# Patient Record
Sex: Male | Born: 2000 | Race: White | Hispanic: No | Marital: Single | Smoking: Current some day smoker
Health system: Southern US, Community
[De-identification: ages and names within clinical notes are randomized; demographics above are authoritative.]

## PROBLEM LIST (undated history)

## (undated) DIAGNOSIS — H6690 Otitis media, unspecified, unspecified ear: Secondary | ICD-10-CM

---
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
## 2019-03-27 ENCOUNTER — Other Ambulatory Visit: Payer: Self-pay

## 2019-03-27 ENCOUNTER — Encounter (HOSPITAL_COMMUNITY): Payer: Self-pay

## 2019-03-27 ENCOUNTER — Emergency Department (HOSPITAL_COMMUNITY): Payer: Medicaid Other

## 2019-03-27 ENCOUNTER — Emergency Department (HOSPITAL_COMMUNITY)
Admission: EM | Admit: 2019-03-27 | Discharge: 2019-03-27 | Disposition: A | Discharge status: Home / Self Care | Payer: Medicaid Other | Attending: Emergency Medicine | Admitting: Emergency Medicine

## 2019-03-27 DIAGNOSIS — Y92410 Unspecified street and highway as the place of occurrence of the external cause: Secondary | ICD-10-CM | POA: Diagnosis not present

## 2019-03-27 DIAGNOSIS — Y999 Unspecified external cause status: Secondary | ICD-10-CM | POA: Insufficient documentation

## 2019-03-27 DIAGNOSIS — M79632 Pain in left forearm: Secondary | ICD-10-CM | POA: Insufficient documentation

## 2019-03-27 DIAGNOSIS — M545 Low back pain: Secondary | ICD-10-CM | POA: Diagnosis not present

## 2019-03-27 DIAGNOSIS — F17210 Nicotine dependence, cigarettes, uncomplicated: Secondary | ICD-10-CM | POA: Insufficient documentation

## 2019-03-27 DIAGNOSIS — Y93I9 Activity, other involving external motion: Secondary | ICD-10-CM | POA: Insufficient documentation
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
## 2019-03-27 NOTE — ED Notes (Signed)
Patient awake alert, color pink,chest clear,good aeration,no retractions, 3 plus pulses,<2sec refill,moves without difficulty,awaiting provider
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
## 2019-03-27 NOTE — Discharge Instructions (Addendum)
Your x-rays were negative for fractures at this time. For your continued pain you can treat with alternating Tylenol, ibuprofen, plus ice. Please follow-up with your primary care provider if you feel that your pain is not improving.

## 2020-08-14 DIAGNOSIS — S93491A Sprain of other ligament of right ankle, initial encounter: Secondary | ICD-10-CM | POA: Diagnosis not present

## 2020-10-28 DIAGNOSIS — Z68.41 Body mass index (BMI) pediatric, less than 5th percentile for age: Secondary | ICD-10-CM | POA: Diagnosis not present

## 2020-10-28 DIAGNOSIS — R634 Abnormal weight loss: Secondary | ICD-10-CM | POA: Diagnosis not present

## 2020-10-28 DIAGNOSIS — Z13220 Encounter for screening for lipoid disorders: Secondary | ICD-10-CM | POA: Diagnosis not present

## 2020-10-28 DIAGNOSIS — Z0001 Encounter for general adult medical examination with abnormal findings: Secondary | ICD-10-CM | POA: Diagnosis not present

## 2020-10-28 DIAGNOSIS — Z0000 Encounter for general adult medical examination without abnormal findings: Secondary | ICD-10-CM | POA: Diagnosis not present

## 2020-11-13 DIAGNOSIS — Z7689 Persons encountering health services in other specified circumstances: Secondary | ICD-10-CM | POA: Diagnosis not present

## 2020-11-13 DIAGNOSIS — Z01818 Encounter for other preprocedural examination: Secondary | ICD-10-CM | POA: Diagnosis not present

## 2020-11-13 DIAGNOSIS — Z6841 Body Mass Index (BMI) 40.0 and over, adult: Secondary | ICD-10-CM | POA: Diagnosis not present

## 2020-11-13 DIAGNOSIS — F17200 Nicotine dependence, unspecified, uncomplicated: Secondary | ICD-10-CM | POA: Diagnosis not present

## 2020-11-13 DIAGNOSIS — M6281 Muscle weakness (generalized): Secondary | ICD-10-CM | POA: Diagnosis not present

## 2020-11-13 DIAGNOSIS — R29898 Other symptoms and signs involving the musculoskeletal system: Secondary | ICD-10-CM | POA: Diagnosis not present

## 2020-11-13 DIAGNOSIS — Z87891 Personal history of nicotine dependence: Secondary | ICD-10-CM | POA: Diagnosis not present

## 2020-11-13 DIAGNOSIS — Z7189 Other specified counseling: Secondary | ICD-10-CM | POA: Diagnosis not present

## 2020-11-13 DIAGNOSIS — Z79899 Other long term (current) drug therapy: Secondary | ICD-10-CM | POA: Diagnosis not present

## 2020-11-13 DIAGNOSIS — K219 Gastro-esophageal reflux disease without esophagitis: Secondary | ICD-10-CM | POA: Diagnosis not present

## 2020-11-13 DIAGNOSIS — I498 Other specified cardiac arrhythmias: Secondary | ICD-10-CM | POA: Diagnosis not present

## 2020-11-13 DIAGNOSIS — Z713 Dietary counseling and surveillance: Secondary | ICD-10-CM | POA: Diagnosis not present

## 2020-12-02 DIAGNOSIS — F17290 Nicotine dependence, other tobacco product, uncomplicated: Secondary | ICD-10-CM | POA: Diagnosis not present

## 2020-12-02 DIAGNOSIS — Z6841 Body Mass Index (BMI) 40.0 and over, adult: Secondary | ICD-10-CM | POA: Diagnosis not present

## 2020-12-02 DIAGNOSIS — F4320 Adjustment disorder, unspecified: Secondary | ICD-10-CM | POA: Diagnosis not present

## 2021-09-07 ENCOUNTER — Other Ambulatory Visit (HOSPITAL_BASED_OUTPATIENT_CLINIC_OR_DEPARTMENT_OTHER): Payer: Self-pay

## 2021-09-07 ENCOUNTER — Emergency Department (HOSPITAL_BASED_OUTPATIENT_CLINIC_OR_DEPARTMENT_OTHER)
Admission: EM | Admit: 2021-09-07 | Discharge: 2021-09-07 | Disposition: A | Discharge status: Home / Self Care | Payer: BC Managed Care – PPO | Attending: Emergency Medicine | Admitting: Emergency Medicine

## 2021-09-07 ENCOUNTER — Other Ambulatory Visit: Payer: Self-pay

## 2021-09-07 ENCOUNTER — Emergency Department (HOSPITAL_BASED_OUTPATIENT_CLINIC_OR_DEPARTMENT_OTHER): Payer: BC Managed Care – PPO

## 2021-09-07 ENCOUNTER — Encounter (HOSPITAL_BASED_OUTPATIENT_CLINIC_OR_DEPARTMENT_OTHER): Payer: Self-pay | Admitting: Obstetrics and Gynecology

## 2021-09-07 DIAGNOSIS — R1030 Lower abdominal pain, unspecified: Secondary | ICD-10-CM | POA: Diagnosis not present

## 2021-09-07 DIAGNOSIS — Y9302 Activity, running: Secondary | ICD-10-CM | POA: Insufficient documentation

## 2021-09-07 DIAGNOSIS — M545 Low back pain, unspecified: Secondary | ICD-10-CM | POA: Diagnosis present

## 2021-09-07 DIAGNOSIS — Y92007 Garden or yard of unspecified non-institutional (private) residence as the place of occurrence of the external cause: Secondary | ICD-10-CM | POA: Insufficient documentation

## 2021-09-07 DIAGNOSIS — W1830XA Fall on same level, unspecified, initial encounter: Secondary | ICD-10-CM | POA: Insufficient documentation

## 2021-09-07 MED ORDER — KETOROLAC TROMETHAMINE 30 MG/ML IJ SOLN
30.0000 mg | Freq: Once | INTRAMUSCULAR | Status: AC
  Administered 2021-09-07: 30 mg via INTRAMUSCULAR
  Filled 2021-09-07: qty 1

## 2021-09-07 MED ORDER — NAPROXEN 500 MG PO TABS
500.0000 mg | ORAL_TABLET | Freq: Two times a day (BID) | ORAL | 0 refills | Status: AC
  Filled 2021-09-07: qty 14, 7d supply, fill #0

## 2021-09-07 MED ORDER — TIZANIDINE HCL 4 MG PO TABS
4.0000 mg | ORAL_TABLET | Freq: Three times a day (TID) | ORAL | 0 refills | Status: AC | PRN
  Filled 2021-09-07: qty 20, 7d supply, fill #0

## 2021-09-07 MED ORDER — DIAZEPAM 5 MG/ML IJ SOLN
5.0000 mg | Freq: Once | INTRAMUSCULAR | Status: AC
  Administered 2021-09-07: 5 mg via INTRAMUSCULAR
  Filled 2021-09-07: qty 2

## 2021-09-07 MED ORDER — DICLOFENAC SODIUM 1 % EX GEL
2.0000 g | Freq: Four times a day (QID) | CUTANEOUS | 0 refills | Status: AC | PRN
  Filled 2021-09-07: qty 100, 30d supply, fill #0

## 2021-09-07 NOTE — ED Provider Notes (Signed)
Emergency Department Provider Note   I have reviewed the triage vital signs and the nursing notes.   HISTORY  Chief Complaint Back Pain   HPI George Walton is a 21 y.o. male with past history reviewed below presents emergency department with lower back pain.  Pain began yesterday after a fall.  He states he was running on the yard twisted his ankle in a hole falling to his buttocks.  No head trauma or loss of consciousness.  He was able to get back up and go inside but has had continued spasming type pain in the lower back.  No pain radiating down the legs.  No numbness or weakness.  No bowel or bladder incontinence.  No urinary retention.  He took NSAIDs and Robaxin at home with some relief although pain seemed to worsen and he was not sure he could get in the car and so called 911 for transport.  He has some pain radiating around to the lower abdomen but denies dysuria, hesitancy, urgency.   Past Medical History:  Diagnosis Date   Ear infection     Review of Systems  Constitutional: No fever/chills Eyes: No visual changes. ENT: No sore throat. Cardiovascular: Denies chest pain. Respiratory: Denies shortness of breath. Gastrointestinal: No abdominal pain.  No nausea, no vomiting.  No diarrhea.  No constipation. Genitourinary: Negative for dysuria. Musculoskeletal: Positive for back pain. Skin: Negative for rash. Neurological: Negative for headaches, focal weakness or numbness.  ____________________________________________   PHYSICAL EXAM:  VITAL SIGNS: ED Triage Vitals  Enc Vitals Group     BP 09/07/21 0820 110/71     Pulse Rate 09/07/21 0820 84     Resp 09/07/21 0820 18     Temp 09/07/21 0820 99.3 F (37.4 C)     Temp src --      SpO2 09/07/21 0820 97 %   Constitutional: Alert and oriented. Well appearing and in no acute distress. Eyes: Conjunctivae are normal.  Head: Atraumatic. Nose: No congestion/rhinnorhea. Mouth/Throat: Mucous membranes are moist.    Neck: No stridor.  Cardiovascular: Normal rate, regular rhythm. Good peripheral circulation. Grossly normal heart sounds.   Respiratory: Normal respiratory effort.  No retractions. Lungs CTAB. Gastrointestinal: Soft and nontender. No distention.  Musculoskeletal: No lower extremity tenderness nor edema. No gross deformities of extremities. No point tenderness in the thoracic or lumbar spine. Neurologic:  Normal speech and language. No gross focal neurologic deficits are appreciated.  Skin:  Skin is warm, dry and intact. No rash noted.  ____________________________________________  RADIOLOGY  CT Renal Stone Study  Result Date: 09/07/2021 CLINICAL DATA:  Flank pain, kidney stone suspected; back and flank pain EXAM: CT ABDOMEN AND PELVIS WITHOUT CONTRAST TECHNIQUE: Multidetector CT imaging of the abdomen and pelvis was performed following the standard protocol without IV contrast. RADIATION DOSE REDUCTION: This exam was performed according to the departmental dose-optimization program which includes automated exposure control, adjustment of the mA and/or kV according to patient size and/or use of iterative reconstruction technique. COMPARISON:  None Available. FINDINGS: Lower chest: No acute abnormality. Hepatobiliary: No focal liver abnormality is seen. No gallstones, gallbladder wall thickening, or biliary dilatation. Pancreas: Unremarkable. No pancreatic ductal dilatation or surrounding inflammatory changes. Spleen: Normal in size without focal abnormality. Adrenals/Urinary Tract: Adrenals are unremarkable. No renal calculi. No hydronephrosis. Bladder is unremarkable. Stomach/Bowel: Stomach is within normal limits. Bowel is normal in caliber. Normal appendix. Vascular/Lymphatic: No significant vascular abnormality. No enlarged nodes. Reproductive: Unremarkable. Other: Abdominal wall is unremarkable.  No free air or free fluid. Musculoskeletal: Mild degenerative disc disease at L5-S1. IMPRESSION: No  acute abnormality. Electronically Signed   By: Guadlupe Spanish M.D.   On: 09/07/2021 10:21    ____________________________________________   PROCEDURES  Procedure(s) performed:   Procedures  None ____________________________________________   INITIAL IMPRESSION / ASSESSMENT AND PLAN / ED COURSE  Pertinent labs & imaging results that were available during my care of the patient were reviewed by me and considered in my medical decision making (see chart for details).   This patient is Presenting for Evaluation of back pain, which does require a range of treatment options, and is a complaint that involves a high risk of morbidity and mortality.  The Differential Diagnoses includes but is not exclusive to musculoskeletal back pain, renal colic, urinary tract infection, pyelonephritis, intra-abdominal causes of back pain, aortic aneurysm or dissection, cauda equina syndrome, sciatica, lumbar disc disease, thoracic disc disease, etc.   Critical Interventions-    Medications  diazepam (VALIUM) injection 5 mg (5 mg Intramuscular Given 09/07/21 0944)  ketorolac (TORADOL) 30 MG/ML injection 30 mg (30 mg Intramuscular Given 09/07/21 0946)    Reassessment after intervention: Pain improved.   Radiologic Tests Ordered, included CT renal. I independently interpreted the images and agree with radiology interpretation.    Social Determinants of Health Risk denies any IVDA.  Medical Decision Making: Summary:  Patient presents emergency department lower back pain after fall.  Some pain radiating around the flank but mainly in the lower back.  CT renal shows no ureteral stone or hydronephrosis.  No bony injury/fracture.  Patient's symptoms, on reassessment, are improved.  He will call for a ride home and will discharge home with muscle relaxer, NSAIDs, plan for early mobility and PCP follow up.  I do not see indication by history or on exam to strongly suggest acute spine emergency requiring emergent  MRI or other advanced imaging.   Disposition: discharge  ____________________________________________  FINAL CLINICAL IMPRESSION(S) / ED DIAGNOSES  Final diagnoses:  Acute midline low back pain without sciatica     NEW OUTPATIENT MEDICATIONS STARTED DURING THIS VISIT:  New Prescriptions   DICLOFENAC SODIUM (VOLTAREN) 1 % GEL    Apply 2 g topically 4 (four) times daily as needed (back pain).   NAPROXEN (NAPROSYN) 500 MG TABLET    Take 1 tablet (500 mg total) by mouth 2 (two) times daily for 7 days.   TIZANIDINE (ZANAFLEX) 4 MG CAPSULE    Take 1 capsule (4 mg total) by mouth 3 (three) times daily as needed for muscle spasms.    Note:  This document was prepared using Dragon voice recognition software and may include unintentional dictation errors.  Alona Bene, MD, [facility] Emergency Medicine    Milany Geck, Arlyss Repress, MD 09/07/21 1057

## 2021-09-07 NOTE — Discharge Instructions (Signed)

## 2021-09-07 NOTE — ED Triage Notes (Addendum)
Patient reports to the ER for back pain after a fall yesterday. Has been taking Robaxin and tylenol without relief. Patient reports difficulty with walking. Patient reports any small movement causes a pulsating pain throughout his back VSS with EMS 132/72 HR 80 SPO2 100 CBG 85
